# Patient Record
Sex: Male | Born: 2005 | Race: White | Hispanic: No | Marital: Single | Smoking: Former smoker
Health system: Southern US, Community
[De-identification: ages and names within clinical notes are randomized; demographics above are authoritative.]

## PROBLEM LIST (undated history)

## (undated) DIAGNOSIS — F909 Attention-deficit hyperactivity disorder, unspecified type: Secondary | ICD-10-CM

## (undated) DIAGNOSIS — K219 Gastro-esophageal reflux disease without esophagitis: Secondary | ICD-10-CM

## (undated) DIAGNOSIS — R1110 Vomiting, unspecified: Secondary | ICD-10-CM

## (undated) DIAGNOSIS — K5900 Constipation, unspecified: Secondary | ICD-10-CM

## (undated) HISTORY — DX: Attention-deficit hyperactivity disorder, unspecified type: F90.9

## (undated) HISTORY — PX: ADENOIDECTOMY: SUR15

## (undated) HISTORY — DX: Gastro-esophageal reflux disease without esophagitis: K21.9

## (undated) HISTORY — DX: Constipation, unspecified: K59.00

## (undated) HISTORY — DX: Vomiting, unspecified: R11.10

---
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
## 2024-03-16 ENCOUNTER — Ambulatory Visit: Payer: MEDICAID | Admitting: Family Medicine

## 2024-03-21 ENCOUNTER — Encounter: Payer: Self-pay | Admitting: Family Medicine

## 2024-03-21 ENCOUNTER — Ambulatory Visit (INDEPENDENT_AMBULATORY_CARE_PROVIDER_SITE_OTHER): Payer: MEDICAID | Admitting: Family Medicine

## 2024-03-21 VITALS — BP 95/57 | HR 53 | Temp 97.4°F | Resp 20 | Ht 68.11 in | Wt 130.0 lb

## 2024-03-21 DIAGNOSIS — I259 Chronic ischemic heart disease, unspecified: Secondary | ICD-10-CM

## 2024-03-21 DIAGNOSIS — R079 Chest pain, unspecified: Secondary | ICD-10-CM | POA: Insufficient documentation

## 2024-03-21 DIAGNOSIS — F320 Major depressive disorder, single episode, mild: Secondary | ICD-10-CM | POA: Diagnosis not present

## 2024-03-21 DIAGNOSIS — S064X9S Epidural hemorrhage with loss of consciousness of unspecified duration, sequela: Secondary | ICD-10-CM

## 2024-03-21 NOTE — Assessment & Plan Note (Signed)
 Is going to work for [employer] and needs clearance by Neurosurgery.  Will set up referral.

## 2024-03-21 NOTE — Assessment & Plan Note (Signed)
 Has CP with exertion.  He thinks this pain is coming from his lung.  He mentioned that pain to [employer] and they insist that he get cleared by a Cardiologist prior to hiring.

## 2024-03-21 NOTE — Assessment & Plan Note (Signed)
 His PHQ-9 score is 13.  He admits that he is depressed but denies HI and SI.  He calls his depression manageable.  He refuses pharmacotherapy and psychology referral.  .

## 2024-03-21 NOTE — Progress Notes (Addendum)
 Established Patient Office Visit  Subjective   Patient ID: Alan Fritz, male    DOB: [birth year]  Age: 18 y.o. MRN: [id number]  Chief Complaint  Patient presents with   Establish Care    HPI Delightful 18 yo with hx of SDH and temporal bone fracture following a fall off of a skateboard.  He needs medical clearance for work.  Needs to see a neurosurgeon for clearance.  Complained of chest pains and Valorie Roosevelt and Elsie Lincoln has asked that he get cleared by Cardiology also.   He denies HA, dizziness and memory disturbance.   He was a cigarette smoker but quit.  Now he vapes nicotine. PHQ-9 is 13 and GAD-7 is 3.  Admits that he has depression but denies HI and SI.  He declines treatment either prescription medication of psychotherapy.      ROS    Objective:     BP (!) 95/57 (BP Location: Left Arm, Patient Position: Sitting, Cuff Size: Normal)   Pulse (!) 53   Temp (!) 97.4 F (36.3 C) (Oral)   Resp 20   Ht 5' 8.11" (1.73 m)   Wt 130 lb (59 kg)   SpO2 98%   BMI 19.70 kg/m    Physical Exam Vitals and nursing note reviewed.  Constitutional:      Appearance: Normal appearance.  HENT:     Head: Normocephalic and atraumatic.  Eyes:     Conjunctiva/sclera: Conjunctivae normal.  Cardiovascular:     Rate and Rhythm: Normal rate and regular rhythm.  Pulmonary:     Effort: Pulmonary effort is normal.     Breath sounds: Normal breath sounds.  Musculoskeletal:     Right lower leg: No edema.     Left lower leg: No edema.  Skin:    General: Skin is warm and dry.  Neurological:     Mental Status: He is alert and oriented to person, place, and time.  Psychiatric:        Mood and Affect: Mood normal.        Behavior: Behavior normal.        Thought Content: Thought content normal.        Judgment: Judgment normal.          No results found for any visits on 03/21/24.    The ASCVD Risk score (Arnett DK, et al., 2019) failed to calculate for the following reasons:    The 2019 ASCVD risk score is only valid for ages 36 to 23    Assessment & Plan:  Epidural hemorrhage with loss of consciousness, sequela [facility]) Assessment & Plan: Is going to work for [employer] and needs clearance by Neurosurgery.  Will set up referral.  Orders: -     Ambulatory referral to Neurosurgery  Chest pain due to myocardial ischemia, unspecified ischemic chest pain type Assessment & Plan: Has CP with exertion.  He thinks this pain is coming from his lung.  He mentioned that pain to [employer] and they insist that he get cleared by a Cardiologist prior to hiring.    Orders: -     Ambulatory referral to Cardiology  Depression, major, single episode, mild (HCC) Assessment & Plan: His PHQ-9 score is 13.  He admits that he is depressed but denies HI and SI.  He calls his depression manageable.  He refuses pharmacotherapy and psychology referral.  .        Return if symptoms worsen or fail to improve.  Alease Medina, MD

## 2024-03-27 NOTE — Progress Notes (Unsigned)
 Referring Physician:  Alease Medina, MD [address] [place],  [place] [address]  Primary Physician:  Alease Medina, MD  History of Present Illness: 03/29/24 Mr. Alan Fritz is here today for clearance prior to starting his new job.  In September 2024 patient was involved in a skateboarding accident in which he suffered loss of consciousness, TBI, and epidural hematoma.  He has recovered very well and does not have any residual headaches, confusion, seizures, issues with vision, hearing, swallowing.  He has no other concerns today and is not complaining of any weakness, numbness, tingling.   ORLANDER NORWOOD has no symptoms of cervical myelopathy.  The symptoms are causing a significant impact on the patient's life.   Review of Systems:  A 10 point review of systems is negative, except for the pertinent positives and negatives detailed in the HPI.  Past Medical History: Past Medical History:  Diagnosis Date   ADHD    Constipation    Gastroesophageal reflux    Vomiting     Past Surgical History: Past Surgical History:  Procedure Laterality Date   ADENOIDECTOMY      Allergies: Allergies as of 03/29/2024   (No Known Allergies)    Medications: Outpatient Encounter Medications as of 03/29/2024  Medication Sig   [DISCONTINUED] acetaminophen (TYLENOL) 500 MG tablet Take 2 tablets (1,000 mg total) by mouth 4 (four) times daily. (Patient not taking: Reported on 03/21/2024)   [DISCONTINUED] ibuprofen (ADVIL) 600 MG tablet Take 1 tablet (600 mg total) by mouth 4 (four) times daily.   [DISCONTINUED] methocarbamol (ROBAXIN-750) 750 MG tablet Take 1 tablet (750 mg total) by mouth 4 (four) times daily. (Patient not taking: Reported on 03/21/2024)   No facility-administered encounter medications on file as of 03/29/2024.    Social History: Social History   Tobacco Use   Smoking status: Former    Types: Cigarettes    Passive exposure: Yes   Smokeless tobacco: Never   Vaping Use   Vaping status: Every Day  Substance Use Topics   Alcohol use: Yes   Drug use: Yes    Types: Marijuana    Family Medical History: Family History  Problem Relation Age of Onset   Crohn's disease Maternal Grandmother    Celiac disease Neg Hx    Ulcers Neg Hx    Cholelithiasis Neg Hx     Physical Examination: @VITALWITHPAIN @  General: Patient is well developed, well nourished, calm, collected, and in no apparent distress. Attention to examination is appropriate.  Psychiatric: Patient is non-anxious.  Head:  Pupils equal, round, and reactive to light.  ENT:  Oral mucosa appears well hydrated.  Neck:   Supple.  Full range of motion.  Respiratory: Patient is breathing without any difficulty.  Extremities: No edema.  Vascular: Palpable dorsal pedal pulses.  Skin:   On exposed skin, there are no abnormal skin lesions.  NEUROLOGICAL:     Awake, alert, oriented to person, place, and time.  Speech is clear and fluent. Fund of knowledge is appropriate.   Cranial Nerves: Pupils equal round and reactive to light.  Facial tone is symmetric.  Facial sensation is symmetric.  ROM of spine: full.  Palpation of spine: non tender.    Cranial nerves grossly intact.  Strength: Side Biceps Triceps Deltoid Interossei Grip Wrist Ext. Wrist Flex.  R 5 5 5 5 5 5 5   L 5 5 5 5 5 5 5    Side Iliopsoas Quads Hamstring PF DF EHL  R 5  5 5 5 5 5   L 5 5 5 5 5 5    Reflexes are 2+ and symmetric at the biceps, triceps, brachioradialis, patella and achilles.   Hoffman's is absent.  Clonus is not present.  Toes are down-going.  Bilateral upper and lower extremity sensation is intact to light touch.    Gait is normal.   No difficulty with tandem gait.   No evidence of dysmetria noted.  Medical Decision Making  Imaging: EXAM: CT HEAD WITHOUT CONTRAST   TECHNIQUE: Contiguous axial images were obtained from the base of the skull through the vertex without intravenous  contrast.   RADIATION DOSE REDUCTION: This exam was performed according to the departmental dose-optimization program which includes automated exposure control, adjustment of the mA and/or kV according to patient size and/or use of iterative reconstruction technique.   COMPARISON:  Prior head CT examinations 08/18/2023 and earlier. [place] bone CT 08/17/2023.   FINDINGS: Brain:   Cerebral volume is normal.   Acute extra-axial hemorrhage overlying the posterior right temporal lobe has not significantly change in extent from the head CT performed earlier today at 1:20 a.m. (again measuring up to 3 mm in thickness) (for instance as seen on series 5, image 19) (series 5, image 25).   Acute subdural hemorrhage along the mid and anterior falx is also unchanged, again measuring up to 2 mm in thickness.   Trace acute subdural hemorrhage is also now noted along the left aspect of the tentorium (for instance as seen on series 5, image 22) (series 3, image 14).   Persistent small foci of pneumocephalus adjacent to the previously described acute right temporal bone fracture.   No demarcated cortical infarct.   No evidence of an intracranial mass.   No midline shift.   Vascular: No hyperdense vessel.   Skull: Acute fracture of the right temporal bone, extending to the right temporoparietal suture, as described on the temporal bone CT of 08/17/2023.   Sinuses/Orbits: No orbital mass or acute orbital finding. No significant paranasal sinus disease at the imaged levels.   Other: Partial opacification of right middle ear cavity and mastoid air cells, as before. Sizable right-sided scalp hematoma.   IMPRESSION: 1. Unchanged acute extra-axial hemorrhage overlying the posterior right temporal lobe, measuring up to 3 mm in thickness. 2. Unchanged acute subdural hemorrhage along the mid and anterior falx, measuring up to 2 mm in thickness. 3. Trace acute subdural hemorrhage now noted  along the left tentorium, possibly due to interval redistribution. 4. Known acute right temporal bone fracture (extending to the right temporoparietal suture). Persistent small adjacent foci of pneumocephalus. 5. Persistent partial opacification of the right middle ear cavity and mastoid air cells. 6. Sizable right-sided scalp hematoma.     I have personally reviewed the images and agree with the above interpretation.  Assessment and Plan: Mr. Minahan is a pleasant 18 y.o. male is here today for clearance prior to starting his new job.  In September 2024 patient was involved in a skateboarding accident in which he suffered loss of consciousness, TBI, and epidural hematoma.  He has recovered very well and does not have any residual headaches, confusion, seizures, issues with vision, hearing, swallowing.  He has no other concerns today and is not complaining of any weakness, numbness, tingling.  On examination, patient has no abnormalities.  CT scan of his head was reviewed from September.  Overall patient had a mild subdural hematoma that was stable and required no intervention at the time.  At this time see no reason patient cannot continue with expected job.  Risks of further head injury through other activities were discussed with patient at length.  He expressed understanding.    Thank you for involving me in the care of this patient.   I spent a total of 30 minutes in both face-to-face and non-face-to-face activities for this visit on the date of this encounter including review of the chart, examination dictation, personally reviewing outside imaging and coronation of care.  Ludwig Safer, PA-C Dept. of Neurosurgery

## 2024-03-29 ENCOUNTER — Ambulatory Visit (INDEPENDENT_AMBULATORY_CARE_PROVIDER_SITE_OTHER): Payer: MEDICAID | Admitting: Physician Assistant

## 2024-03-29 ENCOUNTER — Encounter: Payer: Self-pay | Admitting: Physician Assistant

## 2024-03-29 VITALS — BP 108/68 | Ht 68.0 in | Wt 130.0 lb

## 2024-03-29 DIAGNOSIS — I259 Chronic ischemic heart disease, unspecified: Secondary | ICD-10-CM

## 2024-03-29 DIAGNOSIS — S065X9A Traumatic subdural hemorrhage with loss of consciousness of unspecified duration, initial encounter: Secondary | ICD-10-CM | POA: Diagnosis not present

## 2024-03-29 DIAGNOSIS — I6200 Nontraumatic subdural hemorrhage, unspecified: Secondary | ICD-10-CM

## 2024-04-04 ENCOUNTER — Ambulatory Visit (INDEPENDENT_AMBULATORY_CARE_PROVIDER_SITE_OTHER): Payer: MEDICAID | Admitting: Family Medicine

## 2024-04-04 ENCOUNTER — Encounter: Payer: Self-pay | Admitting: Family Medicine

## 2024-04-04 VITALS — BP 97/64 | HR 69 | Temp 98.2°F | Resp 20 | Ht 68.0 in | Wt 128.0 lb

## 2024-04-04 DIAGNOSIS — R079 Chest pain, unspecified: Secondary | ICD-10-CM

## 2024-04-04 NOTE — Progress Notes (Signed)
   Established Patient Office Visit  Subjective   Patient ID: Alan Fritz, male    DOB: December 29, 2005  Age: 18 y.o. MRN: [id number]  Chief Complaint  Patient presents with   Back Pain    HPI Delightful 18 year old gentleman with Hx SDH and temporal bone fracture following a skateboard accident.  He has been cleared by neurosurgery to work at [employer].  He has a chest pain that has been having for 2 years that needs to be investigated before he can go work for [employer]. Reports that he has a chest pain near the xiphoid process that happens when he is really active.  He takes a deep breath and gets a sharp pain.  It occurs while he is running, having sex, biking or swimming.  He reports a sharp pain when he takes a deep breath.  He can rest for a few minutes and it goes away. The pain is not constant but occurs when he breathes.  He denies concurrent diaphoresis, nausea, dizziness, syncopal feelings.     ROS    Objective:     BP 97/64 (BP Location: Left Arm, Patient Position: Sitting, Cuff Size: Normal)   Pulse 69   Temp 98.2 F (36.8 C) (Oral)   Resp 20   Ht 5\' 8"  (1.727 m)   Wt 128 lb (58.1 kg)   SpO2 96%   BMI 19.46 kg/m    Physical Exam Vitals and nursing note reviewed.  Constitutional:      Appearance: Normal appearance.  HENT:     Head: Normocephalic and atraumatic.  Eyes:     Conjunctiva/sclera: Conjunctivae normal.  Cardiovascular:     Rate and Rhythm: Normal rate and regular rhythm.  Pulmonary:     Effort: Pulmonary effort is normal.     Breath sounds: Normal breath sounds.  Musculoskeletal:     Right lower leg: No edema.     Left lower leg: No edema.  Skin:    General: Skin is warm and dry.  Neurological:     Mental Status: He is alert and oriented to person, place, and time.  Psychiatric:        Mood and Affect: Mood normal.        Behavior: Behavior normal.        Thought Content: Thought content normal.        Judgment: Judgment  normal.          No results found for any visits on 04/04/24.    The ASCVD Risk score (Arnett DK, et al., 2019) failed to calculate for the following reasons:   The 2019 ASCVD risk score is only valid for ages 54 to 40    Assessment & Plan:  Chest pain, unspecified type Assessment & Plan: Has a sharp pain that coincides with taking a deep breath when he is exercising.  The pain only occurs with taking a deep breath.  EKG normal sinus rhythm without ST or T wave changes suggestive of ischemia.  Checking a chest x-ray. Pain sounds most like a stitch.    Orders: -     EKG 12-Lead -     DG Chest 2 View; Future     Return if symptoms worsen or fail to improve.    Pryor Guettler K Yasuko Lapage, MD

## 2024-04-04 NOTE — Assessment & Plan Note (Addendum)
 Has a sharp pain that coincides with taking a deep breath when he is exercising.  The pain only occurs with taking a deep breath.  EKG normal sinus rhythm without ST or T wave changes suggestive of ischemia.  Checking a chest x-ray. Pain sounds most like a stitch.

## 2024-04-05 ENCOUNTER — Ambulatory Visit
Admission: RE | Admit: 2024-04-05 | Discharge: 2024-04-05 | Disposition: A | Discharge status: Home / Self Care | Payer: MEDICAID | Source: Ambulatory Visit | Attending: Family Medicine | Admitting: Family Medicine

## 2024-04-05 ENCOUNTER — Ambulatory Visit
Admission: RE | Admit: 2024-04-05 | Discharge: 2024-04-05 | Disposition: A | Discharge status: Home / Self Care | Payer: MEDICAID | Attending: Family Medicine | Admitting: Family Medicine

## 2024-04-05 DIAGNOSIS — R079 Chest pain, unspecified: Secondary | ICD-10-CM

## 2024-04-07 ENCOUNTER — Encounter: Payer: Self-pay | Admitting: Family Medicine

## 2024-04-07 ENCOUNTER — Telehealth: Payer: Self-pay | Admitting: Family Medicine

## 2024-04-07 NOTE — Telephone Encounter (Signed)
 Copied from CRM [id number]. Topic: General - Other >> Apr 07, 2024  3:12 PM Alan Fritz wrote: Reason for CRM: Patient states that he needs a work clearance. Patient was last seen on 4/22. Callback # [phone]

## 2024-04-11 ENCOUNTER — Telehealth: Payer: Self-pay | Admitting: Family Medicine

## 2024-04-11 ENCOUNTER — Encounter: Payer: Self-pay | Admitting: Family Medicine

## 2024-04-11 ENCOUNTER — Telehealth: Payer: Self-pay

## 2024-04-11 NOTE — Telephone Encounter (Signed)
Patient picked up work note today.

## 2024-04-11 NOTE — Telephone Encounter (Signed)
 Patient informed that will be placed at front desk for pick up.

## 2024-06-08 ENCOUNTER — Ambulatory Visit: Payer: MEDICAID | Attending: Internal Medicine | Admitting: Internal Medicine

## 2024-06-08 NOTE — Progress Notes (Signed)
 Erroneous encounter - please disregard.

## 2024-08-30 ENCOUNTER — Ambulatory Visit: Payer: Self-pay

## 2024-08-30 NOTE — Telephone Encounter (Signed)
 FYI Only or Action Required?: FYI only for provider.  Patient was last seen in primary care on 04/04/2024 by Alan Fritz, Alan K, MD.  Called Nurse Triage reporting Chest Pain.  Symptoms began several months ago.  Interventions attempted: Other: massage.  Symptoms are: unchanged.  Triage Disposition: See Physician Within 24 Hours  Patient/caregiver understands and will follow disposition?: Yes    Copied from CRM #[id number]. Topic: Clinical - Red Word Triage >> Aug 30, 2024 10:03 AM Nathanel BROCKS wrote: Red Word that prompted transfer to Nurse Triage:   Pt is having chest pains. He has had it for 6 mths, it comes and goes.    ----------------------------------------------------------------------- From previous Reason for Contact - Scheduling: Patient/patient representative is calling to schedule an appointment. Refer to attachments for appointment information. Reason for Disposition  [1] Chest pain lasts > 5 minutes AND [2] occurred > 3 days ago (72 hours) AND [3] NO chest pain or cardiac symptoms now  Answer Assessment - Initial Assessment Questions Additional info: Declined acute visit with pcp today, scheduled on preferred date 08/31/24.    1. LOCATION: Where does it hurt?       Lung/chest  2. RADIATION: Does the pain go anywhere else? (e.g., into neck, jaw, arms, back)     no 3. ONSET: When did the chest pain begin? (Minutes, hours or days)       6 months ago-already evaluated 4. PATTERN: Does the pain come and go, or has it been constant since it started?  Does it get worse with exertion?      intermittent 5. DURATION: How long does it last (e.g., seconds, minutes, hours)     Up to 15 minutes  6. SEVERITY: How bad is the pain?  (e.g., Scale 1-10; mild, moderate, or severe)     Tender to touch and sharp when forcefully exhaling 7. CARDIAC RISK FACTORS: Do you have any history of heart problems or risk factors for heart disease? (e.g., angina, prior heart attack;  diabetes, high blood pressure, high cholesterol, smoker, or strong family history of heart disease)     History of intermittent chest pain  8. PULMONARY RISK FACTORS: Do you have any history of lung disease?  (e.g., blood clots in lung, asthma, emphysema, birth control pills)      9. CAUSE: What do you think is causing the chest pain?     Muscle pain.  10. OTHER SYMPTOMS: Do you have any other symptoms? (e.g., dizziness, nausea, vomiting, sweating, fever, difficulty breathing, cough)       Has a muscle knot on back near spine for 1.5 years that is tender, massage help. Denies shortness of breath, radiating chest pain and all other symptoms.  Protocols used: Chest Pain-A-AH

## 2024-08-31 ENCOUNTER — Ambulatory Visit: Payer: MEDICAID | Admitting: Family Medicine

## 2024-09-07 ENCOUNTER — Ambulatory Visit: Payer: MEDICAID | Admitting: Family Medicine

## 2024-09-14 ENCOUNTER — Ambulatory Visit: Payer: MEDICAID | Admitting: Family Medicine

## 2024-09-14 ENCOUNTER — Telehealth: Payer: Self-pay | Admitting: Family Medicine

## 2024-09-14 NOTE — Telephone Encounter (Signed)
 E2C2 called with pt on phone. PT stated he would be 20 minutes or more late. However provider was unable to work pt into schedule. PT was advised if he is still having chest pains to go to urgent care.
